# Patient Record
Sex: Female | Born: 1975 | Hispanic: Yes | Marital: Single | Smoking: Never smoker
Health system: Southern US, Community
[De-identification: ages and names within clinical notes are randomized; demographics above are authoritative.]

## PROBLEM LIST (undated history)

## (undated) DIAGNOSIS — R87629 Unspecified abnormal cytological findings in specimens from vagina: Secondary | ICD-10-CM

## (undated) HISTORY — DX: Unspecified abnormal cytological findings in specimens from vagina: R87.629

---

## 2014-03-21 ENCOUNTER — Encounter: Payer: Self-pay | Admitting: Physician Assistant

## 2014-03-21 ENCOUNTER — Ambulatory Visit (INDEPENDENT_AMBULATORY_CARE_PROVIDER_SITE_OTHER): Payer: BC Managed Care – PPO | Admitting: Physician Assistant

## 2014-03-21 ENCOUNTER — Other Ambulatory Visit (HOSPITAL_COMMUNITY)
Admission: RE | Admit: 2014-03-21 | Discharge: 2014-03-21 | Disposition: A | Discharge status: Home / Self Care | Payer: BC Managed Care – PPO | Source: Ambulatory Visit | Attending: Physician Assistant | Admitting: Physician Assistant

## 2014-03-21 VITALS — BP 112/85 | HR 83 | Ht 67.0 in | Wt 196.0 lb

## 2014-03-21 DIAGNOSIS — J309 Allergic rhinitis, unspecified: Secondary | ICD-10-CM

## 2014-03-21 DIAGNOSIS — Z01419 Encounter for gynecological examination (general) (routine) without abnormal findings: Secondary | ICD-10-CM | POA: Diagnosis not present

## 2014-03-21 DIAGNOSIS — Z124 Encounter for screening for malignant neoplasm of cervix: Secondary | ICD-10-CM | POA: Diagnosis not present

## 2014-03-21 DIAGNOSIS — Z113 Encounter for screening for infections with a predominantly sexual mode of transmission: Secondary | ICD-10-CM | POA: Insufficient documentation

## 2014-03-21 DIAGNOSIS — Z23 Encounter for immunization: Secondary | ICD-10-CM | POA: Diagnosis not present

## 2014-03-21 DIAGNOSIS — Z0000 Encounter for general adult medical examination without abnormal findings: Secondary | ICD-10-CM | POA: Diagnosis not present

## 2014-03-21 DIAGNOSIS — L819 Disorder of pigmentation, unspecified: Secondary | ICD-10-CM

## 2014-03-21 DIAGNOSIS — J302 Other seasonal allergic rhinitis: Secondary | ICD-10-CM | POA: Insufficient documentation

## 2014-03-21 DIAGNOSIS — H02719 Chloasma of unspecified eye, unspecified eyelid and periocular area: Secondary | ICD-10-CM

## 2014-03-21 DIAGNOSIS — N760 Acute vaginitis: Secondary | ICD-10-CM | POA: Insufficient documentation

## 2014-03-21 DIAGNOSIS — R87810 Cervical high risk human papillomavirus (HPV) DNA test positive: Secondary | ICD-10-CM | POA: Diagnosis not present

## 2014-03-21 DIAGNOSIS — Z1151 Encounter for screening for human papillomavirus (HPV): Secondary | ICD-10-CM | POA: Insufficient documentation

## 2014-03-21 NOTE — Patient Instructions (Signed)

## 2014-03-21 NOTE — Progress Notes (Signed)
  Subjective:     Jenna Atkins is a 38 y.o. female and is here for a comprehensive physical exam. The patient reports problems - she does have some ongoing left upper quadrant sharp pain that has bee evaluted and found nothing. not constant just occasional. .  History   Social History  . Marital Status: Single    Spouse Name: N/A    Number of Children: N/A  . Years of Education: N/A   Occupational History  . Not on file.   Social History Main Topics  . Smoking status: Never Smoker   . Smokeless tobacco: Not on file  . Alcohol Use: Yes  . Drug Use: No  . Sexual Activity: Yes   Other Topics Concern  . Not on file   Social History Narrative  . No narrative on file   There are no preventive care reminders to display for this patient.  The following portions of the patient's history were reviewed and updated as appropriate: allergies, current medications, past family history, past medical history, past social history, past surgical history and problem list.  Review of Systems A comprehensive review of systems was negative.   Objective:    BP 112/85  Pulse 83  Ht 5\' 7"  (1.702 m)  Wt 196 lb (88.905 kg)  BMI 30.69 kg/m2  LMP 03/14/2014 General appearance: alert, cooperative and appears stated age Head: Normocephalic, without obvious abnormality, atraumatic Eyes: conjunctivae/corneas clear. PERRL, EOM's intact. Fundi benign., dark circles under bilateral eyes.  Ears: normal TM's and external ear canals both ears Nose: Nares normal. Septum midline. Mucosa normal. No drainage or sinus tenderness. Throat: lips, mucosa, and tongue normal; teeth and gums normal Neck: no adenopathy, no carotid bruit, no JVD, supple, symmetrical, trachea midline and thyroid not enlarged, symmetric, no tenderness/mass/nodules Back: symmetric, no curvature. ROM normal. No CVA tenderness. Lungs: clear to auscultation bilaterally Breasts: normal appearance, no masses or tenderness Heart: regular  rate and rhythm, S1, S2 normal, no murmur, click, rub or gallop Abdomen: soft, non-tender; bowel sounds normal; no masses,  no organomegaly Pelvic: cervix normal in appearance, external genitalia normal, no adnexal masses or tenderness, no cervical motion tenderness, rectovaginal septum normal, uterus normal size, shape, and consistency and vagina normal without discharge Extremities: extremities normal, atraumatic, no cyanosis or edema Pulses: 2+ and symmetric Skin: Skin color, texture, turgor normal. No rashes or lesions Lymph nodes: Cervical, supraclavicular, and axillary nodes normal. Neurologic: Grossly normal    Assessment:    Healthy female exam.      Plan:    CPE- pap done, Tdap given today. Labs done per pt in last 6 months. Will wait for records. Calcium 1200mg  and vitamin D 800 units daily. encourgaed regular exercise at least 150minutes a week.   Black circles/seasonal allergies- suggested zyrtec 10mg  daily.  Left upper quadrant pain- has been evaluated. Not constant. Likely spasm or due to bad posture. Follow up with any changes or worsening symptoms.   See After Visit Summary for Counseling Recommendations

## 2014-03-24 LAB — CYTOLOGY - PAP

## 2014-03-25 ENCOUNTER — Encounter: Payer: Self-pay | Admitting: Physician Assistant

## 2014-03-25 ENCOUNTER — Other Ambulatory Visit: Payer: Self-pay | Admitting: Physician Assistant

## 2014-03-25 DIAGNOSIS — B977 Papillomavirus as the cause of diseases classified elsewhere: Secondary | ICD-10-CM | POA: Insufficient documentation

## 2014-03-25 MED ORDER — METRONIDAZOLE 500 MG PO TABS: 500.0000 mg | ORAL_TABLET | Freq: Two times a day (BID) | ORAL | Status: AC

## 2017-01-01 ENCOUNTER — Encounter: Payer: Self-pay | Admitting: Physician Assistant

## 2017-01-07 ENCOUNTER — Ambulatory Visit (INDEPENDENT_AMBULATORY_CARE_PROVIDER_SITE_OTHER): Payer: BLUE CROSS/BLUE SHIELD | Admitting: Physician Assistant

## 2017-01-07 ENCOUNTER — Encounter: Payer: Self-pay | Admitting: Physician Assistant

## 2017-01-07 VITALS — BP 136/84 | HR 91 | Ht 67.0 in | Wt 217.0 lb

## 2017-01-07 DIAGNOSIS — Z13220 Encounter for screening for lipoid disorders: Secondary | ICD-10-CM

## 2017-01-07 DIAGNOSIS — E669 Obesity, unspecified: Secondary | ICD-10-CM | POA: Insufficient documentation

## 2017-01-07 DIAGNOSIS — B977 Papillomavirus as the cause of diseases classified elsewhere: Secondary | ICD-10-CM

## 2017-01-07 DIAGNOSIS — Z1231 Encounter for screening mammogram for malignant neoplasm of breast: Secondary | ICD-10-CM | POA: Diagnosis not present

## 2017-01-07 DIAGNOSIS — Z0000 Encounter for general adult medical examination without abnormal findings: Secondary | ICD-10-CM | POA: Diagnosis not present

## 2017-01-07 DIAGNOSIS — Z131 Encounter for screening for diabetes mellitus: Secondary | ICD-10-CM

## 2017-01-07 LAB — COMPLETE METABOLIC PANEL WITH GFR
ALT: 43 U/L — ABNORMAL HIGH (ref 6–29)
AST: 37 U/L — ABNORMAL HIGH (ref 10–30)
Albumin: 4.3 g/dL (ref 3.6–5.1)
Alkaline Phosphatase: 99 U/L (ref 33–115)
BUN: 10 mg/dL (ref 7–25)
CALCIUM: 9.4 mg/dL (ref 8.6–10.2)
CHLORIDE: 105 mmol/L (ref 98–110)
CO2: 25 mmol/L (ref 20–31)
CREATININE: 0.8 mg/dL (ref 0.50–1.10)
GFR, Est African American: >89 mL/min (ref >=60)
Glucose, Bld: 105 mg/dL — ABNORMAL HIGH (ref 65–99)
Potassium: 4.6 mmol/L (ref 3.5–5.3)
Sodium: 139 mmol/L (ref 135–146)
Total Bilirubin: 0.5 mg/dL (ref 0.2–1.2)
Total Protein: 7.1 g/dL (ref 6.1–8.1)

## 2017-01-07 LAB — LIPID PANEL
CHOLESTEROL: 245 mg/dL — AB (ref <200)
HDL: 40 mg/dL — AB (ref >50)
LDL CALC: 153 mg/dL — AB (ref <100)
TRIGLYCERIDES: 259 mg/dL — AB (ref <150)
Total CHOL/HDL Ratio: 6.1 Ratio — ABNORMAL HIGH (ref <5.0)
VLDL: 52 mg/dL — AB (ref <30)

## 2017-01-07 LAB — TSH: TSH: 3.45 m[IU]/L

## 2017-01-07 NOTE — Patient Instructions (Signed)

## 2017-01-07 NOTE — Progress Notes (Signed)
Subjective:     Jenna Atkins is a 41 y.o. female and is here for a comprehensive physical exam. The patient reports no problems.  Social History   Social History  . Marital status: Single    Spouse name: N/A  . Number of children: N/A  . Years of education: N/A   Occupational History  . Not on file.   Social History Main Topics  . Smoking status: Never Smoker  . Smokeless tobacco: Never Used  . Alcohol use Yes  . Drug use: No  . Sexual activity: Yes   Other Topics Concern  . Not on file   Social History Narrative  . No narrative on file   Health Maintenance  Topic Date Due  . HIV Screening  01/06/1991  . INFLUENZA VACCINE  03/12/2017  . PAP SMEAR  03/21/2017  . TETANUS/TDAP  03/21/2024    The following portions of the patient's history were reviewed and updated as appropriate: allergies, current medications, past family history, past medical history, past social history, past surgical history and problem list.  Review of Systems A comprehensive review of systems was negative.   Objective:    BP 136/84   Pulse 91   Ht 5\' 7"  (1.702 m)   Wt 217 lb (98.4 kg)   BMI 33.99 kg/m  General appearance: alert, cooperative and appears stated age Head: Normocephalic, without obvious abnormality, atraumatic Eyes: conjunctivae/corneas clear. PERRL, EOM's intact. Fundi benign. Ears: normal TM's and external ear canals both ears Nose: Nares normal. Septum midline. Mucosa normal. No drainage or sinus tenderness. Throat: lips, mucosa, and tongue normal; teeth and gums normal Neck: no adenopathy, no carotid bruit, no JVD, supple, symmetrical, trachea midline and thyroid not enlarged, symmetric, no tenderness/mass/nodules Back: symmetric, no curvature. ROM normal. No CVA tenderness. Lungs: clear to auscultation bilaterally Breasts: normal appearance, no masses or tenderness Heart: regular rate and rhythm, S1, S2 normal, no murmur, click, rub or gallop Abdomen: soft,  non-tender; bowel sounds normal; no masses,  no organomegaly Extremities: extremities normal, atraumatic, no cyanosis or edema Pulses: 2+ and symmetric Skin: Skin color, texture, turgor normal. No rashes or lesions Lymph nodes: Cervical, supraclavicular, and axillary nodes normal. Neurologic: Alert and oriented X 3, normal strength and tone. Normal symmetric reflexes. Normal coordination and gait    Assessment:    Healthy female exam.      Plan:    [name].[name].Jenna Atkins was seen today for annual exam.  Diagnoses and all orders for this visit:  Routine physical examination -     Lipid panel -     COMPLETE METABOLIC PANEL WITH GFR -     TSH -     MM SCREENING BREAST TOMO BILATERAL; Future  Screening for lipid disorders -     Lipid panel  Screening for diabetes mellitus -     COMPLETE METABOLIC PANEL WITH GFR  Visit for screening mammogram -     MM SCREENING BREAST TOMO BILATERAL; Future  Obesity (BMI 35.0-39.9 without comorbidity) -     TSH  HPV in female   Ordered mammogram.  Pt will return for pap due to being on her cycle today. Pt sexually active with one partner. She mentions wanting to try to get pregnant. She currently uses condoms.  Encouraged to start prenatal vitamins. Explained she would be high risk. If not pregnant in 3 months I would consider referral to ob/gyn.  She is obese. Consider changing up diet. 150 minutes of exercise a week.  Pt declines any  STD testing.  See After Visit Summary for Counseling Recommendations

## 2017-01-08 ENCOUNTER — Encounter: Payer: Self-pay | Admitting: Physician Assistant

## 2017-01-08 ENCOUNTER — Telehealth: Payer: Self-pay

## 2017-01-08 DIAGNOSIS — R748 Abnormal levels of other serum enzymes: Secondary | ICD-10-CM | POA: Insufficient documentation

## 2017-01-08 DIAGNOSIS — E785 Hyperlipidemia, unspecified: Secondary | ICD-10-CM | POA: Insufficient documentation

## 2017-01-08 DIAGNOSIS — R899 Unspecified abnormal finding in specimens from other organs, systems and tissues: Secondary | ICD-10-CM

## 2017-01-08 DIAGNOSIS — R7301 Impaired fasting glucose: Secondary | ICD-10-CM | POA: Insufficient documentation

## 2017-01-08 NOTE — Progress Notes (Signed)
Call pt: thyroid in normal range.  LDL elevated, TG elevated. Need to cut fats in diet. Start fish oil 4000mg  daily for TG.  Elevated glucose need to add a1c.  Elevated liver enzymes. Are you using a lot of tylenol or drinking alcohol? Stop if you are and repeat in 2 weeks.

## 2017-01-08 NOTE — Progress Notes (Signed)
She can wait until the 2 week recheck of liver enyzmes.

## 2017-01-15 ENCOUNTER — Other Ambulatory Visit (HOSPITAL_COMMUNITY)
Admission: RE | Admit: 2017-01-15 | Discharge: 2017-01-15 | Disposition: A | Discharge status: Home / Self Care | Payer: BLUE CROSS/BLUE SHIELD | Source: Ambulatory Visit | Attending: Physician Assistant | Admitting: Physician Assistant

## 2017-01-15 ENCOUNTER — Encounter: Payer: Self-pay | Admitting: Physician Assistant

## 2017-01-15 ENCOUNTER — Ambulatory Visit (INDEPENDENT_AMBULATORY_CARE_PROVIDER_SITE_OTHER): Payer: BLUE CROSS/BLUE SHIELD | Admitting: Physician Assistant

## 2017-01-15 ENCOUNTER — Ambulatory Visit: Payer: BLUE CROSS/BLUE SHIELD

## 2017-01-15 VITALS — BP 126/81 | HR 89 | Ht 67.0 in | Wt 215.0 lb

## 2017-01-15 DIAGNOSIS — B977 Papillomavirus as the cause of diseases classified elsewhere: Secondary | ICD-10-CM

## 2017-01-15 DIAGNOSIS — Z01419 Encounter for gynecological examination (general) (routine) without abnormal findings: Secondary | ICD-10-CM | POA: Insufficient documentation

## 2017-01-15 DIAGNOSIS — R87613 High grade squamous intraepithelial lesion on cytologic smear of cervix (HGSIL): Secondary | ICD-10-CM | POA: Diagnosis not present

## 2017-01-15 NOTE — Progress Notes (Signed)
Subjective:     Jenna Atkins is a 41 y.o. woman who comes in today for a  pap smear only. Her most recent annual exam was on 01/07/17. Her most recent Pap smear was 2 years ago and showed HPV positive with no abnormal cells. Previous abnormal Pap smears: yes - 2 years ago. Contraception: condoms  The following portions of the patient's history were reviewed and updated as appropriate: allergies, current medications, past family history, past medical history, past social history, past surgical history and problem list.  Review of Systems A comprehensive review of systems was negative.   Objective:    BP 126/81   Pulse 89   Ht 5\' 7"  (1.702 m)   Wt 215 lb (97.5 kg)   BMI 33.67 kg/m  Pelvic Exam: external genitalia normal, no adnexal masses or tenderness, no cervical motion tenderness, vagina normal without discharge and area around cervical os was friable with some appearance of ulceration around the entire circumference.. Pap smear obtained.   Assessment:    Screening pap smear. Pt declined STD testing. She mentions wanting to get pregnant. She would be high risk. Start prenatal vitamins now. Follow up closely with GYN if having trouble getting pregnant. Wait until we get pap results to start trying.   Plan:    Follow up in 1 year, or as indicated by Pap results.

## 2017-01-19 ENCOUNTER — Encounter: Payer: Self-pay | Admitting: Physician Assistant

## 2017-01-19 DIAGNOSIS — R87613 High grade squamous intraepithelial lesion on cytologic smear of cervix (HGSIL): Secondary | ICD-10-CM | POA: Insufficient documentation

## 2017-01-19 LAB — CYTOLOGY - PAP
Diagnosis: HIGH — AB
HPV: DETECTED — AB

## 2017-01-19 NOTE — Addendum Note (Signed)
Addended byTandy Gaw: Riyan Haile on: 01/19/2017 08:46 PM   Modules accepted: Orders

## 2017-01-21 ENCOUNTER — Ambulatory Visit (INDEPENDENT_AMBULATORY_CARE_PROVIDER_SITE_OTHER): Payer: BLUE CROSS/BLUE SHIELD

## 2017-01-21 DIAGNOSIS — Z1231 Encounter for screening mammogram for malignant neoplasm of breast: Secondary | ICD-10-CM

## 2017-01-21 DIAGNOSIS — Z0000 Encounter for general adult medical examination without abnormal findings: Secondary | ICD-10-CM

## 2017-01-21 DIAGNOSIS — R899 Unspecified abnormal finding in specimens from other organs, systems and tissues: Secondary | ICD-10-CM | POA: Diagnosis not present

## 2017-01-22 LAB — HEPATIC FUNCTION PANEL
ALT: 40 U/L — ABNORMAL HIGH (ref 6–29)
AST: 27 U/L (ref 10–30)
Albumin: 4.3 g/dL (ref 3.6–5.1)
Alkaline Phosphatase: 89 U/L (ref 33–115)
Bilirubin, Direct: 0.1 mg/dL (ref <=0.2)
Indirect Bilirubin: 0.4 mg/dL (ref 0.2–1.2)
Total Bilirubin: 0.5 mg/dL (ref 0.2–1.2)
Total Protein: 7.2 g/dL (ref 6.1–8.1)

## 2017-01-22 LAB — HEMOGLOBIN A1C
Hgb A1c MFr Bld: 5.5 % (ref <5.7)
Mean Plasma Glucose: 111 mg/dL

## 2017-01-22 NOTE — Telephone Encounter (Signed)
Call pt: A1C normal range but climbing towards pre-diabetes. Start now watching carbs and sugars in diet.   Liver enzymes decrease from last recheck. Avoid any excessive/daily tylenol and alcohol. I think if we targeted weight loss likely would completely improve. Recheck in 3 months.

## 2017-01-22 NOTE — Progress Notes (Signed)
Call pt: normal mammogram. Follow up in 1 year.

## 2017-12-17 ENCOUNTER — Other Ambulatory Visit: Payer: Self-pay | Admitting: Physician Assistant

## 2017-12-17 DIAGNOSIS — Z1231 Encounter for screening mammogram for malignant neoplasm of breast: Secondary | ICD-10-CM

## 2018-01-22 ENCOUNTER — Ambulatory Visit: Payer: BLUE CROSS/BLUE SHIELD

## 2018-03-13 ENCOUNTER — Ambulatory Visit (INDEPENDENT_AMBULATORY_CARE_PROVIDER_SITE_OTHER): Payer: BLUE CROSS/BLUE SHIELD

## 2018-03-13 DIAGNOSIS — Z1231 Encounter for screening mammogram for malignant neoplasm of breast: Secondary | ICD-10-CM

## 2018-03-13 NOTE — Progress Notes (Signed)
Call pt: normal mammogram follow up in 1 year.

## 2018-03-24 ENCOUNTER — Ambulatory Visit (INDEPENDENT_AMBULATORY_CARE_PROVIDER_SITE_OTHER): Payer: BLUE CROSS/BLUE SHIELD | Admitting: Physician Assistant

## 2018-03-24 ENCOUNTER — Other Ambulatory Visit (HOSPITAL_COMMUNITY)
Admission: RE | Admit: 2018-03-24 | Discharge: 2018-03-24 | Disposition: A | Discharge status: Home / Self Care | Payer: BLUE CROSS/BLUE SHIELD | Source: Ambulatory Visit | Attending: Physician Assistant | Admitting: Physician Assistant

## 2018-03-24 ENCOUNTER — Encounter: Payer: Self-pay | Admitting: Physician Assistant

## 2018-03-24 VITALS — BP 141/91 | HR 104 | Ht 67.0 in | Wt 218.0 lb

## 2018-03-24 DIAGNOSIS — B977 Papillomavirus as the cause of diseases classified elsewhere: Secondary | ICD-10-CM

## 2018-03-24 DIAGNOSIS — Z0000 Encounter for general adult medical examination without abnormal findings: Secondary | ICD-10-CM | POA: Diagnosis not present

## 2018-03-24 DIAGNOSIS — R87810 Cervical high risk human papillomavirus (HPV) DNA test positive: Secondary | ICD-10-CM | POA: Diagnosis not present

## 2018-03-24 DIAGNOSIS — Z01419 Encounter for gynecological examination (general) (routine) without abnormal findings: Secondary | ICD-10-CM | POA: Diagnosis not present

## 2018-03-24 DIAGNOSIS — E785 Hyperlipidemia, unspecified: Secondary | ICD-10-CM

## 2018-03-24 DIAGNOSIS — Z13220 Encounter for screening for lipoid disorders: Secondary | ICD-10-CM

## 2018-03-24 DIAGNOSIS — Z6834 Body mass index (BMI) 34.0-34.9, adult: Secondary | ICD-10-CM

## 2018-03-24 DIAGNOSIS — Z131 Encounter for screening for diabetes mellitus: Secondary | ICD-10-CM

## 2018-03-24 DIAGNOSIS — R87613 High grade squamous intraepithelial lesion on cytologic smear of cervix (HGSIL): Secondary | ICD-10-CM | POA: Diagnosis not present

## 2018-03-24 DIAGNOSIS — K649 Unspecified hemorrhoids: Secondary | ICD-10-CM | POA: Insufficient documentation

## 2018-03-24 DIAGNOSIS — R87610 Atypical squamous cells of undetermined significance on cytologic smear of cervix (ASC-US): Secondary | ICD-10-CM | POA: Insufficient documentation

## 2018-03-24 DIAGNOSIS — F329 Major depressive disorder, single episode, unspecified: Secondary | ICD-10-CM

## 2018-03-24 DIAGNOSIS — E6609 Other obesity due to excess calories: Secondary | ICD-10-CM | POA: Diagnosis not present

## 2018-03-24 DIAGNOSIS — R7301 Impaired fasting glucose: Secondary | ICD-10-CM

## 2018-03-24 DIAGNOSIS — R4589 Other symptoms and signs involving emotional state: Secondary | ICD-10-CM | POA: Insufficient documentation

## 2018-03-24 DIAGNOSIS — Z800 Family history of malignant neoplasm of digestive organs: Secondary | ICD-10-CM | POA: Insufficient documentation

## 2018-03-24 DIAGNOSIS — K5901 Slow transit constipation: Secondary | ICD-10-CM | POA: Insufficient documentation

## 2018-03-24 DIAGNOSIS — F430 Acute stress reaction: Secondary | ICD-10-CM | POA: Insufficient documentation

## 2018-03-24 MED ORDER — ATORVASTATIN CALCIUM 80 MG PO TABS: 80.0000 mg | ORAL_TABLET | Freq: Every day | ORAL | 1 refills | Status: AC

## 2018-03-24 NOTE — Progress Notes (Signed)
Subjective:     Jenna Atkins is a 42 y.o. female and is here for a comprehensive physical exam. The patient reports she is concerned about colon cancer. at 6462 her father was dx with colon cancer and very sick. she has always had hemorrhoids and battled constipation. denies any stool changes, melena, hematochezia or abdominal pain. [name].  HPV with high grade squamous cells detected on last pap smear. Patient was called by GYN but never followed up with GYN.  Social History   Socioeconomic History  . Marital status: Single    Spouse name: Not on file  . Number of children: Not on file  . Years of education: Not on file  . Highest education level: Not on file  Occupational History  . Not on file  Social Needs  . Financial resource strain: Not on file  . Food insecurity:    Worry: Not on file    Inability: Not on file  . Transportation needs:    Medical: Not on file    Non-medical: Not on file  Tobacco Use  . Smoking status: Never Smoker  . Smokeless tobacco: Never Used  Substance and Sexual Activity  . Alcohol use: Yes  . Drug use: No  . Sexual activity: Yes  Lifestyle  . Physical activity:    Days per week: Not on file    Minutes per session: Not on file  . Stress: Not on file  Relationships  . Social connections:    Talks on phone: Not on file    Gets together: Not on file    Attends religious service: Not on file    Active member of club or organization: Not on file    Attends meetings of clubs or organizations: Not on file    Relationship status: Not on file  . Intimate partner violence:    Fear of current or ex partner: Not on file    Emotionally abused: Not on file    Physically abused: Not on file    Forced sexual activity: Not on file  Other Topics Concern  . Not on file  Social History Narrative  . Not on file   Health Maintenance  Topic Date Due  . HIV Screening  01/06/1991  . INFLUENZA VACCINE  03/12/2018  . PAP SMEAR  01/16/2020  . TETANUS/TDAP   03/21/2024    The following portions of the patient's history were reviewed and updated as appropriate: allergies, current medications, past family history, past medical history, past social history, past surgical history and problem list.  Review of Systems Pertinent items noted in HPI and remainder of comprehensive ROS otherwise negative.   Objective:    BP (!) 141/91   Pulse (!) 104   Ht 5\' 7"  (1.702 m)   Wt 218 lb (98.9 kg)   LMP 03/13/2018   BMI 34.14 kg/m  General appearance: alert, cooperative, appears stated age and mildly obese Head: Normocephalic, without obvious abnormality, atraumatic Eyes: conjunctivae/corneas clear. PERRL, EOM's intact. Fundi benign. Ears: normal TM's and external ear canals both ears Nose: Nares normal. Septum midline. Mucosa normal. No drainage or sinus tenderness. Throat: lips, mucosa, and tongue normal; teeth and gums normal Neck: no adenopathy, no carotid bruit, no JVD, supple, symmetrical, trachea midline and thyroid not enlarged, symmetric, no tenderness/mass/nodules Back: symmetric, no curvature. ROM normal. No CVA tenderness. Lungs: clear to auscultation bilaterally Heart: regular rate and rhythm, S1, S2 normal, no murmur, click, rub or gallop Abdomen: soft, non-tender; bowel sounds normal; no masses,  no organomegaly Pelvic: external genitalia normal, no adnexal masses or tenderness, no cervical motion tenderness, uterus normal size, shape, and consistency, vagina normal without discharge and cervix is friable and bled easily and very erythematous. as well as greenish discharge. Extremities: extremities normal, atraumatic, no cyanosis or edema Pulses: 2+ and symmetric Skin: Skin color, texture, turgor normal. No rashes or lesions Lymph nodes: Cervical, supraclavicular, and axillary nodes normal. Neurologic: Alert and oriented X 3, normal strength and tone. Normal symmetric reflexes. Normal coordination and gait    Assessment:    Healthy  female exam.      Plan:     Marland KitchenMarland KitchenNevayah was seen today for annual exam.  Diagnoses and all orders for this visit:  Routine physical examination -     Lipid Panel w/reflex Direct LDL -     COMPLETE METABOLIC PANEL WITH GFR -     TSH -     CBC with Differential/Platelet -     Cytology - PAP  Screening for lipid disorders -     Lipid Panel w/reflex Direct LDL  Screening for diabetes mellitus -     COMPLETE METABOLIC PANEL WITH GFR  Class 1 obesity due to excess calories without serious comorbidity with body mass index (BMI) of 34.0 to 34.9 in adult -     TSH  Hemorrhoids, unspecified hemorrhoid type -     Ambulatory referral to Gastroenterology  Slow transit constipation -     Ambulatory referral to Gastroenterology  Family history of colon cancer -     Ambulatory referral to Gastroenterology  Dyslipidemia (high LDL; low HDL)  Elevated fasting glucose  High grade squamous intraepithelial lesion (HGSIL) on cytologic smear of cervix -     Ambulatory referral to Obstetrics / Gynecology -     Cytology - PAP  HPV in female -     Ambulatory referral to Obstetrics / Gynecology -     Cytology - PAP  Stress reaction  Depressed mood   .[name] Depression screen PHQ 2/9 03/24/2018  Decreased Interest 1  Down, Depressed, Hopeless 2  PHQ - 2 Score 3  Altered sleeping 1  Tired, decreased energy 2  Change in appetite 1  Feeling bad or failure about yourself  0  Trouble concentrating 1  Moving slowly or fidgety/restless 0  Suicidal thoughts 0  PHQ-9 Score 8  Difficult doing work/chores Somewhat difficult    .[name] GAD 7 : Generalized Anxiety Score 03/24/2018  Nervous, Anxious, on Edge 1  Control/stop worrying 2  Worry too much - different things 2  Trouble relaxing 2  Restless 2  Easily annoyed or irritable 2  Afraid - awful might happen 2  Total GAD 7 Score 13  Anxiety Difficulty Very difficult    .[name] Discussed 150 minutes of exercise a week.  Encouraged vitamin D 1000  units and Calcium 1300mg  or 4 servings of dairy a day.  Mammogram up to date.  Fasting labs ordered today.  PaP repeated today. Pt never followed up with GYN. Last year she did have HPV and high grade squamous cell changes. Repeated pap today and made referral to GYN next door.   Pt is concerned about colon cancer. She is young to screen. She does have hemorrhoids, constipation, and family hx of colon cancer. I will refer to GI and let them decide on early screening or not. No worrisome signs today. Discussed treatment of constipation/hemorrhoids.   Discussed fathers dx and stress in her life. Consider counseling. Follow up if  would like to consider medication. Start walking regularly for stress relief.   See After Visit Summary for Counseling Recommendations

## 2018-03-24 NOTE — Addendum Note (Signed)
Addended by: Jomarie LongsBREEBACK, JADE L on: 03/24/2018 09:59 PM   Modules accepted: Orders

## 2018-03-24 NOTE — Patient Instructions (Addendum)

## 2018-03-24 NOTE — Progress Notes (Signed)
Call pt: your cholesterol is VERY elevated and you are at a HIGH risk of cardiovascular event with these numbers. I am sending a medication to help lower this. Recheck in 4 months.  Liver, kidney look good.

## 2018-03-25 LAB — CBC WITH DIFFERENTIAL/PLATELET
BASOS PCT: 1 %
Basophils Absolute: 61 cells/uL (ref 0–200)
Eosinophils Absolute: 140 cells/uL (ref 15–500)
Eosinophils Relative: 2.3 %
HCT: 40.1 % (ref 35.0–45.0)
Hemoglobin: 13.3 g/dL (ref 11.7–15.5)
Lymphs Abs: 1983 cells/uL (ref 850–3900)
MCH: 28.3 pg (ref 27.0–33.0)
MCHC: 33.2 g/dL (ref 32.0–36.0)
MCV: 85.3 fL (ref 80.0–100.0)
MONOS PCT: 4.8 %
MPV: 9.4 fL (ref 7.5–12.5)
Neutro Abs: 3623 cells/uL (ref 1500–7800)
Neutrophils Relative %: 59.4 %
PLATELETS: 398 10*3/uL (ref 140–400)
RBC: 4.7 10*6/uL (ref 3.80–5.10)
RDW: 13.2 % (ref 11.0–15.0)
TOTAL LYMPHOCYTE: 32.5 %
WBC: 6.1 10*3/uL (ref 3.8–10.8)
WBCMIX: 293 {cells}/uL (ref 200–950)

## 2018-03-25 LAB — LIPID PANEL W/REFLEX DIRECT LDL
CHOL/HDL RATIO: 6.4 (calc) — AB (ref <5.0)
Cholesterol: 293 mg/dL — ABNORMAL HIGH (ref <200)
HDL: 46 mg/dL — AB (ref >50)
LDL Cholesterol (Calc): 191 mg/dL (calc) — ABNORMAL HIGH
NON-HDL CHOLESTEROL (CALC): 247 mg/dL — AB (ref <130)
Triglycerides: 331 mg/dL — ABNORMAL HIGH (ref <150)

## 2018-03-25 LAB — COMPLETE METABOLIC PANEL WITH GFR
AG RATIO: 1.6 (calc) (ref 1.0–2.5)
ALT: 19 U/L (ref 6–29)
AST: 21 U/L (ref 10–30)
Albumin: 4.4 g/dL (ref 3.6–5.1)
Alkaline phosphatase (APISO): 92 U/L (ref 33–115)
BUN: 10 mg/dL (ref 7–25)
CHLORIDE: 104 mmol/L (ref 98–110)
CO2: 25 mmol/L (ref 20–32)
Calcium: 9.6 mg/dL (ref 8.6–10.2)
Creat: 0.83 mg/dL (ref 0.50–1.10)
GFR, EST NON AFRICAN AMERICAN: 87 mL/min/{1.73_m2} (ref >=60)
GFR, Est African American: 101 mL/min/{1.73_m2} (ref >=60)
GLUCOSE: 102 mg/dL — AB (ref 65–99)
Globulin: 2.8 g/dL (calc) (ref 1.9–3.7)
POTASSIUM: 4.5 mmol/L (ref 3.5–5.3)
Sodium: 138 mmol/L (ref 135–146)
Total Bilirubin: 0.5 mg/dL (ref 0.2–1.2)
Total Protein: 7.2 g/dL (ref 6.1–8.1)

## 2018-03-25 LAB — TSH: TSH: 3.51 mIU/L

## 2018-03-27 LAB — CYTOLOGY - PAP
DIAGNOSIS: UNDETERMINED — AB
HPV (WINDOPATH): DETECTED — AB
HPV 16/18/45 GENOTYPING: NEGATIVE

## 2018-03-28 NOTE — Progress Notes (Signed)
Call pt: last year cells looked a little more concerning. This year atypical. Will forward results to GYN since you have a follow up with them.  FYI on patient you will see on 8/21. See last years pap results as well.

## 2018-04-01 ENCOUNTER — Ambulatory Visit: Payer: BLUE CROSS/BLUE SHIELD | Admitting: Obstetrics & Gynecology

## 2018-04-01 ENCOUNTER — Encounter: Payer: Self-pay | Admitting: Obstetrics & Gynecology

## 2018-04-01 VITALS — BP 124/86 | HR 112 | Resp 16 | Ht 67.0 in | Wt 218.0 lb

## 2018-04-01 DIAGNOSIS — N871 Moderate cervical dysplasia: Secondary | ICD-10-CM | POA: Diagnosis not present

## 2018-04-01 DIAGNOSIS — R87810 Cervical high risk human papillomavirus (HPV) DNA test positive: Secondary | ICD-10-CM

## 2018-04-01 DIAGNOSIS — R87610 Atypical squamous cells of undetermined significance on cytologic smear of cervix (ASC-US): Secondary | ICD-10-CM | POA: Diagnosis not present

## 2018-04-01 DIAGNOSIS — Z23 Encounter for immunization: Secondary | ICD-10-CM | POA: Diagnosis not present

## 2018-04-01 NOTE — Progress Notes (Signed)
   Subjective:    Patient ID: Jenna PrudeKeren Atkins, female    DOB: [birth year], 42 y.o.   MRN: [id number]  HPI  She is here for a colpo due to a pap that showed ASCUS + HR HPV. She had a pap last year that showed HGSIL.   Review of Systems She uses condoms for contraception. [place] is [place]    Objective:   Physical Exam Breathing, conversing, and ambulating normally Well nourished, well hydrated Latina, no apparent distress  UPT negative, consent signed, time out done Cervix prepped with acetic acid. Transformation zone seen in its entirety. Colpo adequate. Acetowhite changes noted in a circumferential fashion around the os, extending about a cm in all directions. The cervix was friable, some mosaicism noted at the 1 and 5 o'clock positions where I biopsied.  Silver nitrate was used for hemostasis. ECC obtained. She tolerated the procedure well.     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

## 2018-04-01 NOTE — Addendum Note (Signed)
Addended by: Granville LewisLARK, Ieesha Abbasi L on: 04/01/2018 11:37 AM   Modules accepted: Orders

## 2018-04-02 DIAGNOSIS — K5900 Constipation, unspecified: Secondary | ICD-10-CM | POA: Diagnosis not present

## 2018-04-02 DIAGNOSIS — K649 Unspecified hemorrhoids: Secondary | ICD-10-CM | POA: Diagnosis not present

## 2018-04-02 DIAGNOSIS — K921 Melena: Secondary | ICD-10-CM | POA: Diagnosis not present

## 2018-04-02 DIAGNOSIS — Z800 Family history of malignant neoplasm of digestive organs: Secondary | ICD-10-CM | POA: Diagnosis not present

## 2018-04-05 NOTE — Progress Notes (Signed)
Yes please make appt to fully evaluate any head issues.

## 2018-04-07 ENCOUNTER — Ambulatory Visit: Payer: BLUE CROSS/BLUE SHIELD | Admitting: Obstetrics & Gynecology

## 2018-04-07 ENCOUNTER — Encounter: Payer: Self-pay | Admitting: Obstetrics & Gynecology

## 2018-04-07 VITALS — BP 124/78 | HR 82 | Resp 16 | Ht 67.0 in | Wt 218.0 lb

## 2018-04-07 DIAGNOSIS — D069 Carcinoma in situ of cervix, unspecified: Secondary | ICD-10-CM | POA: Diagnosis not present

## 2018-04-07 NOTE — Progress Notes (Signed)
   Subjective:    Patient ID: Jenna PrudeKeren Landau, female    DOB: [birth year], [age] y.o.   MRN: [id number]  HPI 42 yo P0 here to discuss treatment options. She had a ASCUS pap this year and a HGSIL pap last year. 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 The ECC was negative. She started the Gardasil series on the day of her colpo.    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

## 2018-04-10 ENCOUNTER — Ambulatory Visit (INDEPENDENT_AMBULATORY_CARE_PROVIDER_SITE_OTHER): Payer: BLUE CROSS/BLUE SHIELD | Admitting: Physician Assistant

## 2018-04-10 ENCOUNTER — Ambulatory Visit (INDEPENDENT_AMBULATORY_CARE_PROVIDER_SITE_OTHER): Payer: BLUE CROSS/BLUE SHIELD

## 2018-04-10 ENCOUNTER — Encounter: Payer: Self-pay | Admitting: Physician Assistant

## 2018-04-10 VITALS — BP 133/82 | HR 95 | Ht 67.0 in | Wt 218.0 lb

## 2018-04-10 DIAGNOSIS — M79631 Pain in right forearm: Secondary | ICD-10-CM

## 2018-04-10 DIAGNOSIS — M79644 Pain in right finger(s): Secondary | ICD-10-CM | POA: Diagnosis not present

## 2018-04-10 DIAGNOSIS — M25531 Pain in right wrist: Secondary | ICD-10-CM | POA: Diagnosis not present

## 2018-04-10 DIAGNOSIS — M654 Radial styloid tenosynovitis [de Quervain]: Secondary | ICD-10-CM | POA: Diagnosis not present

## 2018-04-10 DIAGNOSIS — Z23 Encounter for immunization: Secondary | ICD-10-CM

## 2018-04-10 MED ORDER — MELOXICAM 15 MG PO TABS: 15.0000 mg | ORAL_TABLET | Freq: Every day | ORAL | 0 refills | Status: DC

## 2018-04-10 NOTE — Progress Notes (Signed)
Call pt: normal xrays. Continue with treatment plan as discussed in office.

## 2018-04-10 NOTE — Patient Instructions (Signed)
De Quervain Tenosynovitis  Tendons attach muscles to bones. They also help with joint movements. When tendons become irritated or swollen, it is called tendinitis.  The extensor pollicis brevis (EPB) tendon connects the EPB muscle to a bone that is near the base of the thumb. The EPB muscle helps to straighten and extend the thumb. De Quervain tenosynovitis is a condition in which the EPB tendon lining (sheath) becomes irritated, thickened, and swollen. This condition is sometimes called stenosing tenosynovitis. This condition causes pain on the thumb side of the back of the wrist.  What are the causes?  Causes of this condition include:   Activities that repeatedly cause your thumb and wrist to extend.   A sudden increase in activity or change in activity that affects your wrist.    What increases the risk?  This condition is more likely to develop in:   Females.   People who have diabetes.   Women who have recently given birth.   People who are over 40 years of age.   People who do activities that involve repeated hand and wrist motions, such as tennis, racquetball, volleyball, gardening, and taking care of children.   People who do heavy labor.   People who have poor wrist strength and flexibility.   People who do not warm up properly before activities.    What are the signs or symptoms?  Symptoms of this condition include:   Pain or tenderness over the thumb side of the back of the wrist when your thumb and wrist are not moving.   Pain that gets worse when you straighten your thumb or extend your thumb or wrist.   Pain when the injured area is touched.   Locking or catching of the thumb joint while you bend and straighten your thumb.   Decreased thumb motion due to pain.   Swelling over the affected area.    How is this diagnosed?  This condition is diagnosed with a medical history and physical exam. Your health care provider will ask for details about your injury and ask about your  symptoms.  How is this treated?  Treatment may include the use of icing and medicines to reduce pain and swelling. You may also be advised to wear a splint or brace to limit your thumb and wrist motion. In less severe cases, treatment may also include working with a physical therapist to strengthen your wrist and calm the irritation around your EPB tendon sheath. In severe cases, surgery may be needed.  Follow these instructions at home:  If you have a splint or brace:   Wear it as told by your health care provider. Remove it only as told by your health care provider.   Loosen the splint or brace if your fingers become numb and tingle, or if they turn cold and blue.   Keep the splint or brace clean and dry.  Managing pain, stiffness, and swelling   If directed, apply ice to the injured area.  ? Put ice in a plastic bag.  ? Place a towel between your skin and the bag.  ? Leave the ice on for 20 minutes, 2-3 times per day.   Move your fingers often to avoid stiffness and to lessen swelling.   Raise (elevate) the injured area above the level of your heart while you are sitting or lying down.  General instructions   Return to your normal activities as told by your health care provider. Ask your health care provider   what activities are safe for you.   Take over-the-counter and prescription medicines only as told by your health care provider.   Keep all follow-up visits as told by your health care provider. This is important.   Do not drive or operate heavy machinery while taking prescription pain medicine.  Contact a health care provider if:   Your pain, tenderness, or swelling gets worse, even if you have had treatment.   You have numbness or tingling in your wrist, hand, or fingers on the injured side.  This information is not intended to replace advice given to you by your health care provider. Make sure you discuss any questions you have with your health care provider.  Document Released: 07/29/2005  Document Revised: 01/04/2016 Document Reviewed: 10/04/2014  [employer] Interactive Patient Education  2018 [employer]

## 2018-04-10 NOTE — Progress Notes (Signed)
Subjective:    Patient ID: Jenna PrudeKeren Wickersham, female    DOB: 04-15-1976, 42 y.o.   MRN: [id number]  HPI  Pt is a 42 yo female who presents to the clinic with right wrist and thumb pain on the radial side for 2 months. [place] when a [place]ball hit her right forearm. She started having pain but was bearable until was reinjuried a few weeks ago something else hitting the right forearm. She is right handed and continues to be very bothersome to get things done. She is slowly losing a lot of strength in her right hand. She is having a lot of pain moving her right thumb at all. She has tried some OTC creams with little benefit.   .. Active Ambulatory Problems    Diagnosis Date Noted  . Seasonal allergies 03/21/2014  . Dark circle under eye 03/21/2014  . 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 Obesity (BMI 35.0-39.9 without comorbidity) 01/07/2017  . Elevated fasting glucose 01/08/2017  . Elevated liver enzymes 01/08/2017  . Dyslipidemia (high LDL; low HDL) 01/08/2017  . High grade squamous intraepithelial lesion (HGSIL) on cytologic smear of cervix 01/19/2017  . Slow transit constipation 03/24/2018  . Hemorrhoids 03/24/2018  . Family history of colon cancer 03/24/2018  . Stress reaction 03/24/2018  . Class 1 obesity due to excess calories without serious comorbidity with body mass index (BMI) of 34.0 to 34.9 in adult 03/24/2018  . Depressed mood 03/24/2018  . 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 No Resolved Ambulatory Problems   Past Medical History:  Diagnosis Date  . Vaginal Pap smear, abnormal       Review of Systems See HPI>     Objective:   Physical Exam  Constitutional: She is oriented to person, place, and time. She appears well-developed and well-nourished.  HENT:  Head: Normocephalic and atraumatic.  Cardiovascular: Normal rate and regular rhythm.  Musculoskeletal:  Right thumb strength 0/5.  Positive  finkelstein, right side only.  [facility] up into the right forearm. No redness, swelling, warmth.  Neurological: She is alert and oriented to person, place, and time.          Assessment & Plan:  [name].[name].Diagnoses and all orders for this visit:  De Quervain's tenosynovitis, right -     DG Forearm Right -     DG Wrist Complete Right -     meloxicam (MOBIC) 15 MG tablet; Take 1 tablet (15 mg total) by mouth daily.  Right forearm pain -     DG Forearm Right -     DG Wrist Complete Right -     meloxicam (MOBIC) 15 MG tablet; Take 1 tablet (15 mg total) by mouth daily.  Thumb pain, right -     DG Forearm Right -     DG Wrist Complete Right -     meloxicam (MOBIC) 15 MG tablet; Take 1 tablet (15 mg total) by mouth daily.  Need for immunization against influenza -     Flu Vaccine QUAD 36+ mos IM   xrays confirm no evidence of fracture or worrisome lesions.  Symptoms and physical exam consistent with tenosynovitis.  Fitted for thumb spica wrist brace. Wear for 2 weeks. mobic given to use for next 2 weeks daily. Given 1 box sample of pennsaid to apply to wrist and thumb area.  Ice bid.  Home stretches given to start after  1 week.  Follow up with sports medicine here in clinic if not improving.

## 2018-04-12 ENCOUNTER — Encounter: Payer: Self-pay | Admitting: Physician Assistant

## 2018-04-12 DIAGNOSIS — M654 Radial styloid tenosynovitis [de Quervain]: Secondary | ICD-10-CM | POA: Insufficient documentation

## 2018-04-14 DIAGNOSIS — D123 Benign neoplasm of transverse colon: Secondary | ICD-10-CM | POA: Diagnosis not present

## 2018-04-14 DIAGNOSIS — Z1211 Encounter for screening for malignant neoplasm of colon: Secondary | ICD-10-CM | POA: Diagnosis not present

## 2018-04-14 DIAGNOSIS — D120 Benign neoplasm of cecum: Secondary | ICD-10-CM | POA: Diagnosis not present

## 2018-04-14 DIAGNOSIS — Z800 Family history of malignant neoplasm of digestive organs: Secondary | ICD-10-CM | POA: Diagnosis not present

## 2018-04-14 LAB — HM COLONOSCOPY

## 2018-04-27 ENCOUNTER — Ambulatory Visit (INDEPENDENT_AMBULATORY_CARE_PROVIDER_SITE_OTHER): Payer: BLUE CROSS/BLUE SHIELD | Admitting: Obstetrics & Gynecology

## 2018-04-27 ENCOUNTER — Other Ambulatory Visit (HOSPITAL_COMMUNITY)
Admission: RE | Admit: 2018-04-27 | Discharge: 2018-04-27 | Disposition: A | Discharge status: Home / Self Care | Payer: BLUE CROSS/BLUE SHIELD | Source: Ambulatory Visit | Attending: Obstetrics & Gynecology | Admitting: Obstetrics & Gynecology

## 2018-04-27 ENCOUNTER — Encounter: Payer: Self-pay | Admitting: Obstetrics & Gynecology

## 2018-04-27 VITALS — BP 135/94 | HR 111 | Resp 16 | Ht 67.0 in | Wt 220.0 lb

## 2018-04-27 DIAGNOSIS — Z01812 Encounter for preprocedural laboratory examination: Secondary | ICD-10-CM | POA: Diagnosis not present

## 2018-04-27 DIAGNOSIS — D069 Carcinoma in situ of cervix, unspecified: Secondary | ICD-10-CM | POA: Insufficient documentation

## 2018-04-27 DIAGNOSIS — Z3202 Encounter for pregnancy test, result negative: Secondary | ICD-10-CM

## 2018-04-27 DIAGNOSIS — D061 Carcinoma in situ of exocervix: Secondary | ICD-10-CM | POA: Diagnosis not present

## 2018-04-27 LAB — POCT URINE PREGNANCY: Preg Test, Ur: NEGATIVE

## 2018-04-27 NOTE — Progress Notes (Signed)
   Subjective:    Patient ID: Jenna Atkins, female    DOB: [birth year], 42 y.o.   MRN: [id number]  HPI 42 yo P0 here for LEEP due to CIN 2&3 on her cervical biopsies recently.   Review of Systems     Objective:   Physical Exam Breathing, conversing, and ambulating normally Well nourished, well hydrated White female, no apparent distress   Risks, benefits, alternatives, and limitations of procedure explained to patient, including pain, bleeding, infection, failure to remove abnormal tissue and failure to cure dysplasia, need for repeat procedures, damage to pelvic organs, cervical incompetence.  Role of HPV,cervical dysplasia and need for close followup was empasized. Informed written consent was obtained. All questions were answered. Time out performed. Urine pregnancy test was negative.  Procedure: The patient was placed in lithotomy position and the bivalved coated speculum was placed in the patient's vagina. A grounding pad placed on the patient. Acetic acid was applied to the cervix and areas of decreased uptake were noted around the transformation zone.   Local anesthesia was administered via an intracervical block using 15cc of 2% Lidocaine with epinephrine. The suction was turned on and the large LEEP loop  on 50 Watts of cutting current was used to excise the entire transformation zone and any areas of visible dysplasia. I then did a top hat.  I obtained an ECC.  Excellent hemostasis was achieved using roller ball coagulation set at 50 Watts coagulation current. At the end, I applied Monsel's solution. The speculum was removed from the vagina. Specimens were sent to pathology.  The patient tolerated the procedure well. Post-operative instructions given to patient, including instruction to seek medical attention for persistent bright red bleeding, fever, abdominal/pelvic pain, dysuria, nausea or vomiting. She was also told about the possibility of having copious yellow to black tinged  discharge for weeks. She was counseled to avoid anything in the vagina (sex/douching/tampons) for 3 weeks. She has a 4 week post-operative check to assess wound healing, review results and discuss further management.      Assessment & Plan:  CIN 2&3- await LEEP pathology Come back 3 weeks

## 2018-04-30 ENCOUNTER — Encounter: Payer: Self-pay | Admitting: Physician Assistant

## 2018-05-16 ENCOUNTER — Other Ambulatory Visit: Payer: Self-pay | Admitting: Physician Assistant

## 2018-05-16 DIAGNOSIS — M654 Radial styloid tenosynovitis [de Quervain]: Secondary | ICD-10-CM

## 2018-05-16 DIAGNOSIS — M79631 Pain in right forearm: Secondary | ICD-10-CM

## 2018-05-16 DIAGNOSIS — M79644 Pain in right finger(s): Secondary | ICD-10-CM

## 2018-05-18 ENCOUNTER — Ambulatory Visit (INDEPENDENT_AMBULATORY_CARE_PROVIDER_SITE_OTHER): Payer: BLUE CROSS/BLUE SHIELD | Admitting: Obstetrics & Gynecology

## 2018-05-18 ENCOUNTER — Encounter: Payer: Self-pay | Admitting: Obstetrics & Gynecology

## 2018-05-18 VITALS — BP 122/84 | HR 92 | Resp 16 | Ht 67.0 in | Wt 220.0 lb

## 2018-05-18 DIAGNOSIS — Z23 Encounter for immunization: Secondary | ICD-10-CM

## 2018-05-18 DIAGNOSIS — D069 Carcinoma in situ of cervix, unspecified: Secondary | ICD-10-CM

## 2018-05-18 DIAGNOSIS — Z9889 Other specified postprocedural states: Secondary | ICD-10-CM

## 2018-05-18 NOTE — Progress Notes (Signed)
   Subjective:    Patient ID: Jenna Atkins, female    DOB: 11-28-1975, 42 y.o.   MRN: [id number]  HPI  42 yo P0 here for follow up after LEEP on 04/27/18 for CIN 2&3. She had not had any post op issues/problems.  Review of Systems     Objective:   Physical Exam Breathing, conversing, and ambulating normally Well nourished, well hydrated White female, no apparent distress Cervix-healing well Pathology showed CIN3 with negative endocervical margins and positive ectocervical margins    Assessment & Plan:  Post op - stable + ectocervical margins after LEEP- repeat pap in 4 months. Repeat LEEP if pap not normal. Gardasil #2 today

## 2018-06-01 ENCOUNTER — Ambulatory Visit: Payer: BLUE CROSS/BLUE SHIELD

## 2018-09-28 DIAGNOSIS — J069 Acute upper respiratory infection, unspecified: Secondary | ICD-10-CM | POA: Diagnosis not present

## 2018-10-02 ENCOUNTER — Ambulatory Visit: Payer: BLUE CROSS/BLUE SHIELD

## 2019-02-16 ENCOUNTER — Other Ambulatory Visit: Payer: Self-pay | Admitting: Physician Assistant

## 2019-02-16 DIAGNOSIS — Z1231 Encounter for screening mammogram for malignant neoplasm of breast: Secondary | ICD-10-CM

## 2019-03-18 ENCOUNTER — Other Ambulatory Visit: Payer: Self-pay

## 2019-03-18 ENCOUNTER — Ambulatory Visit (INDEPENDENT_AMBULATORY_CARE_PROVIDER_SITE_OTHER): Payer: BLUE CROSS/BLUE SHIELD

## 2019-03-18 DIAGNOSIS — Z1231 Encounter for screening mammogram for malignant neoplasm of breast: Secondary | ICD-10-CM

## 2019-03-19 ENCOUNTER — Other Ambulatory Visit: Payer: Self-pay | Admitting: Physician Assistant

## 2019-03-19 DIAGNOSIS — R928 Other abnormal and inconclusive findings on diagnostic imaging of breast: Secondary | ICD-10-CM

## 2019-03-19 NOTE — Progress Notes (Signed)
Left breast asymmetry and need more imaging. Let us know if not contacted for more images.

## 2019-03-25 ENCOUNTER — Ambulatory Visit
Admission: RE | Admit: 2019-03-25 | Discharge: 2019-03-25 | Disposition: A | Discharge status: Home / Self Care | Payer: BLUE CROSS/BLUE SHIELD | Source: Ambulatory Visit | Attending: Physician Assistant | Admitting: Physician Assistant

## 2019-03-25 ENCOUNTER — Other Ambulatory Visit: Payer: Self-pay | Admitting: Physician Assistant

## 2019-03-25 ENCOUNTER — Other Ambulatory Visit: Payer: Self-pay

## 2019-03-25 DIAGNOSIS — R928 Other abnormal and inconclusive findings on diagnostic imaging of breast: Secondary | ICD-10-CM | POA: Diagnosis not present

## 2019-03-25 DIAGNOSIS — N6489 Other specified disorders of breast: Secondary | ICD-10-CM | POA: Diagnosis not present

## 2019-03-25 NOTE — Progress Notes (Signed)
Normal mammogram

## 2019-09-22 DIAGNOSIS — M9901 Segmental and somatic dysfunction of cervical region: Secondary | ICD-10-CM | POA: Diagnosis not present

## 2019-09-22 DIAGNOSIS — M5412 Radiculopathy, cervical region: Secondary | ICD-10-CM | POA: Diagnosis not present

## 2019-09-22 DIAGNOSIS — M9902 Segmental and somatic dysfunction of thoracic region: Secondary | ICD-10-CM | POA: Diagnosis not present

## 2019-09-22 DIAGNOSIS — M546 Pain in thoracic spine: Secondary | ICD-10-CM | POA: Diagnosis not present

## 2020-01-18 IMAGING — DX DG FOREARM 2V*R*
2 series · 2 of 2 positions shown · non-contrast
Comparison: None.

CLINICAL DATA: Right forearm pain for 2 months following blunt
trauma, initial encounter

EXAM:
RIGHT FOREARM - 2 VIEW

[forearm ap]
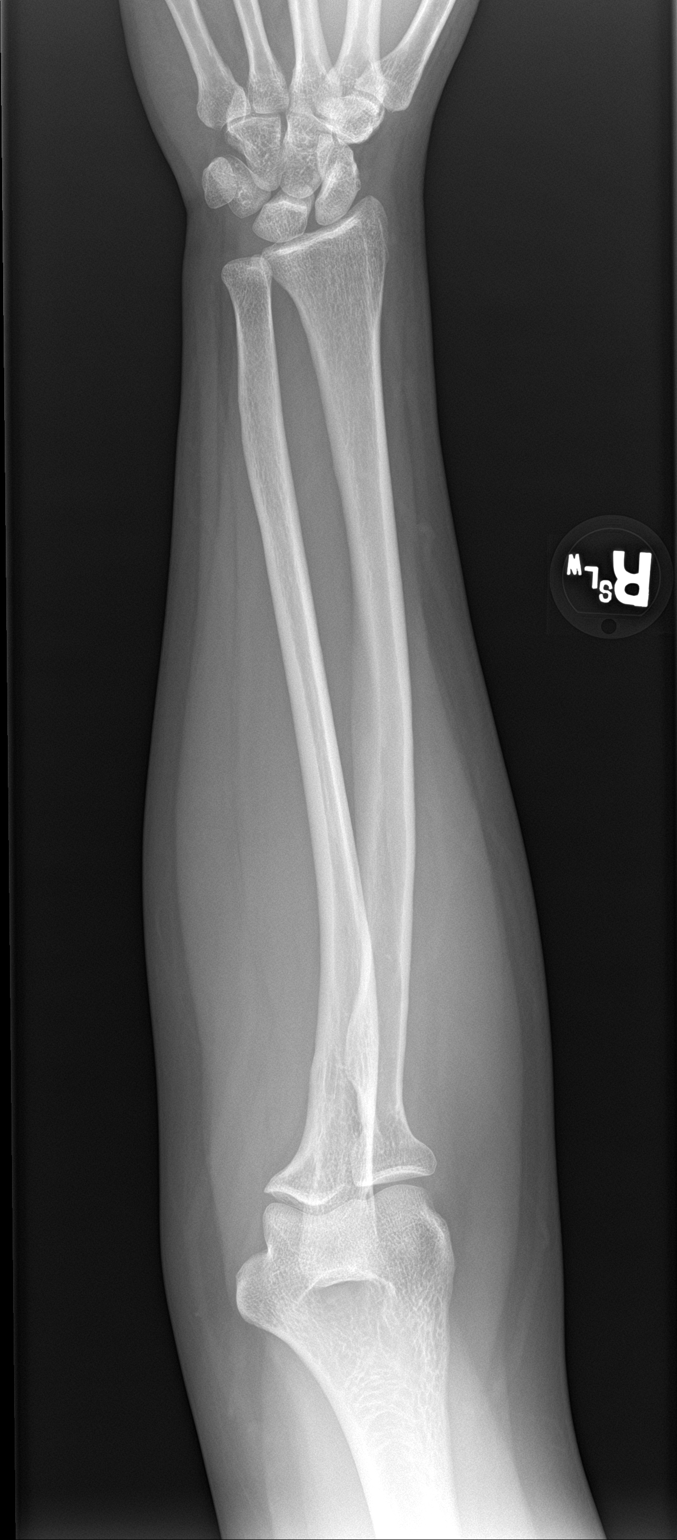

[forearm lat]
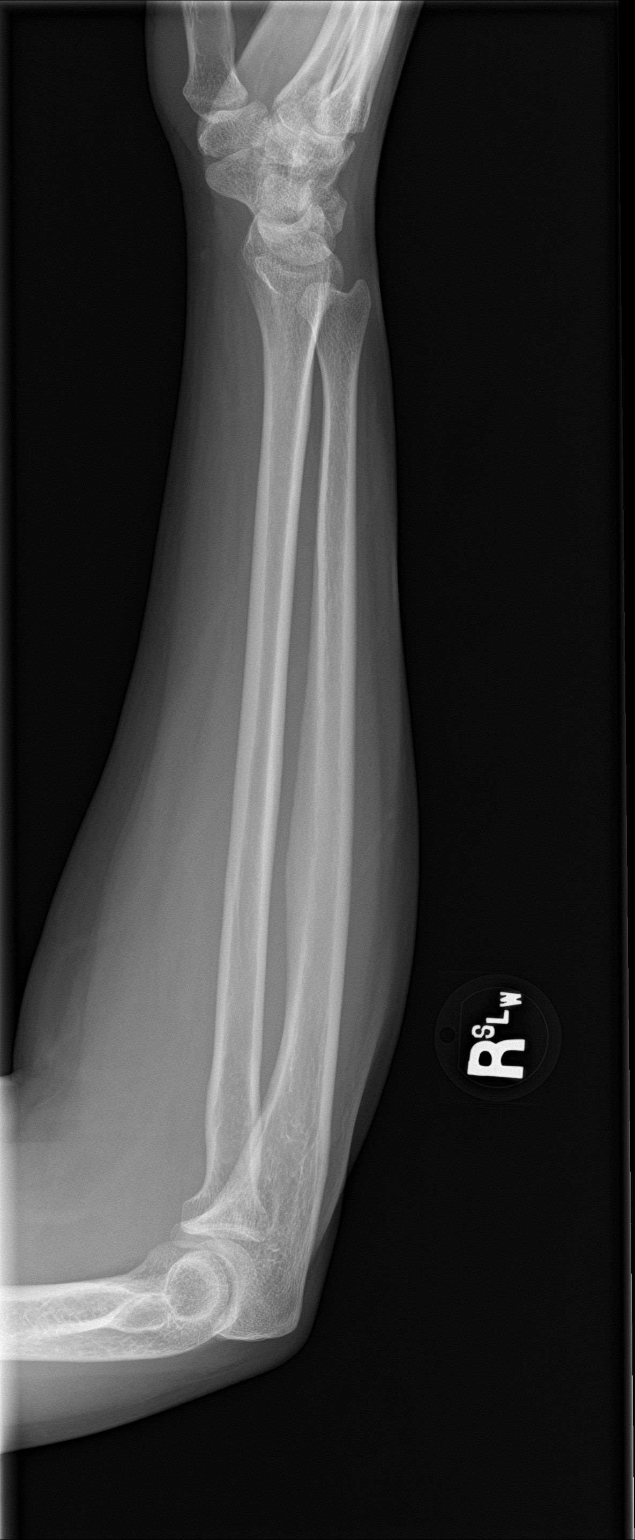

[2 of 2 positions shown; findings below may reference images not displayed]

FINDINGS: There is no evidence of fracture or other focal bone lesions. Soft
tissues are unremarkable.
IMPRESSION: No acute abnormality noted.

## 2020-01-18 IMAGING — DX DG WRIST COMPLETE 3+V*R*
4 series · 4 of 4 positions shown · non-contrast
Comparison: None.

CLINICAL DATA: Blunt trauma 2 months ago with persistent wrist and
thumb pain, initial encounter

EXAM:
RIGHT WRIST - COMPLETE 3+ VIEW

[wrist pa]
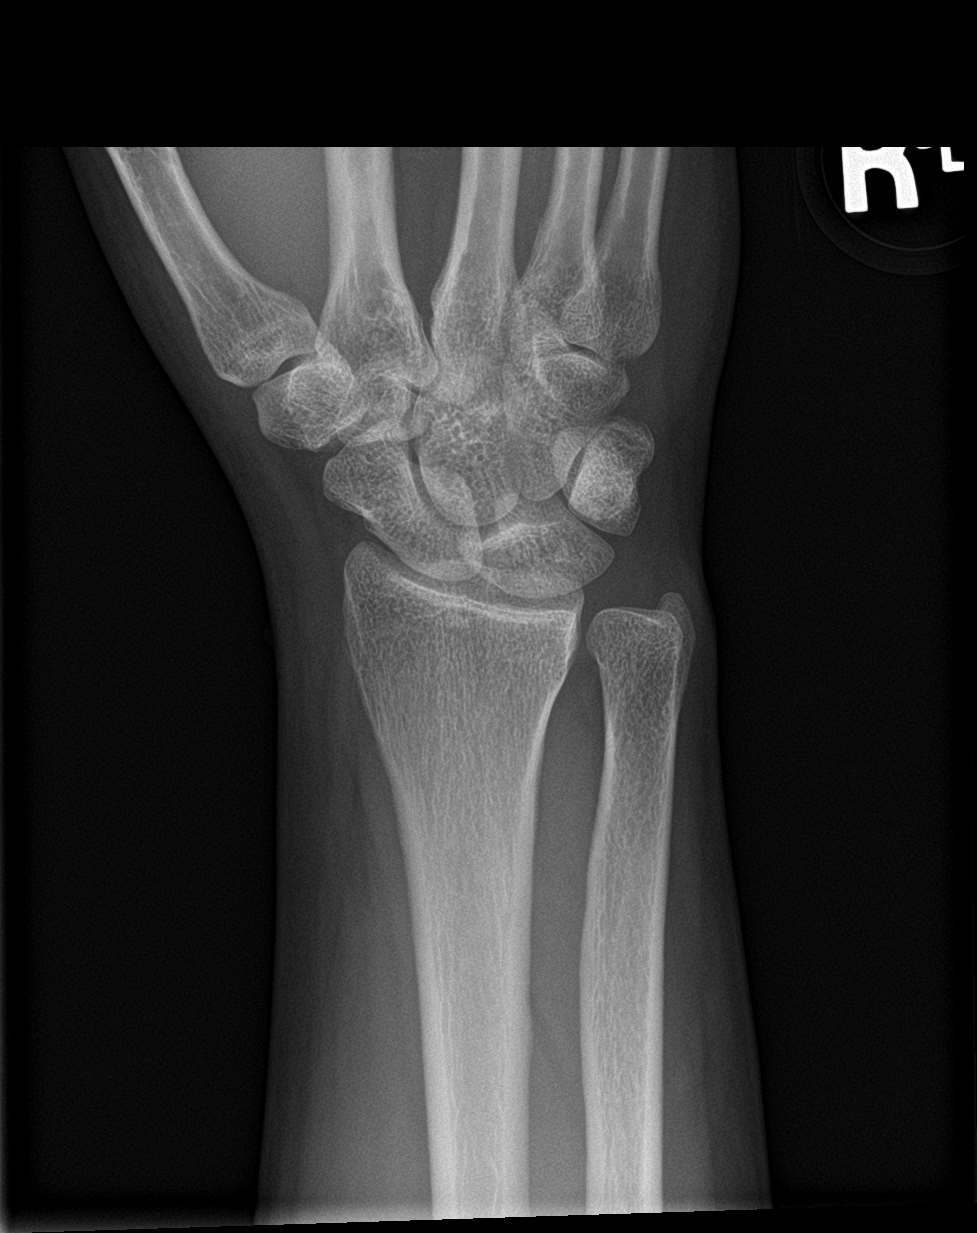

[wrist obl]
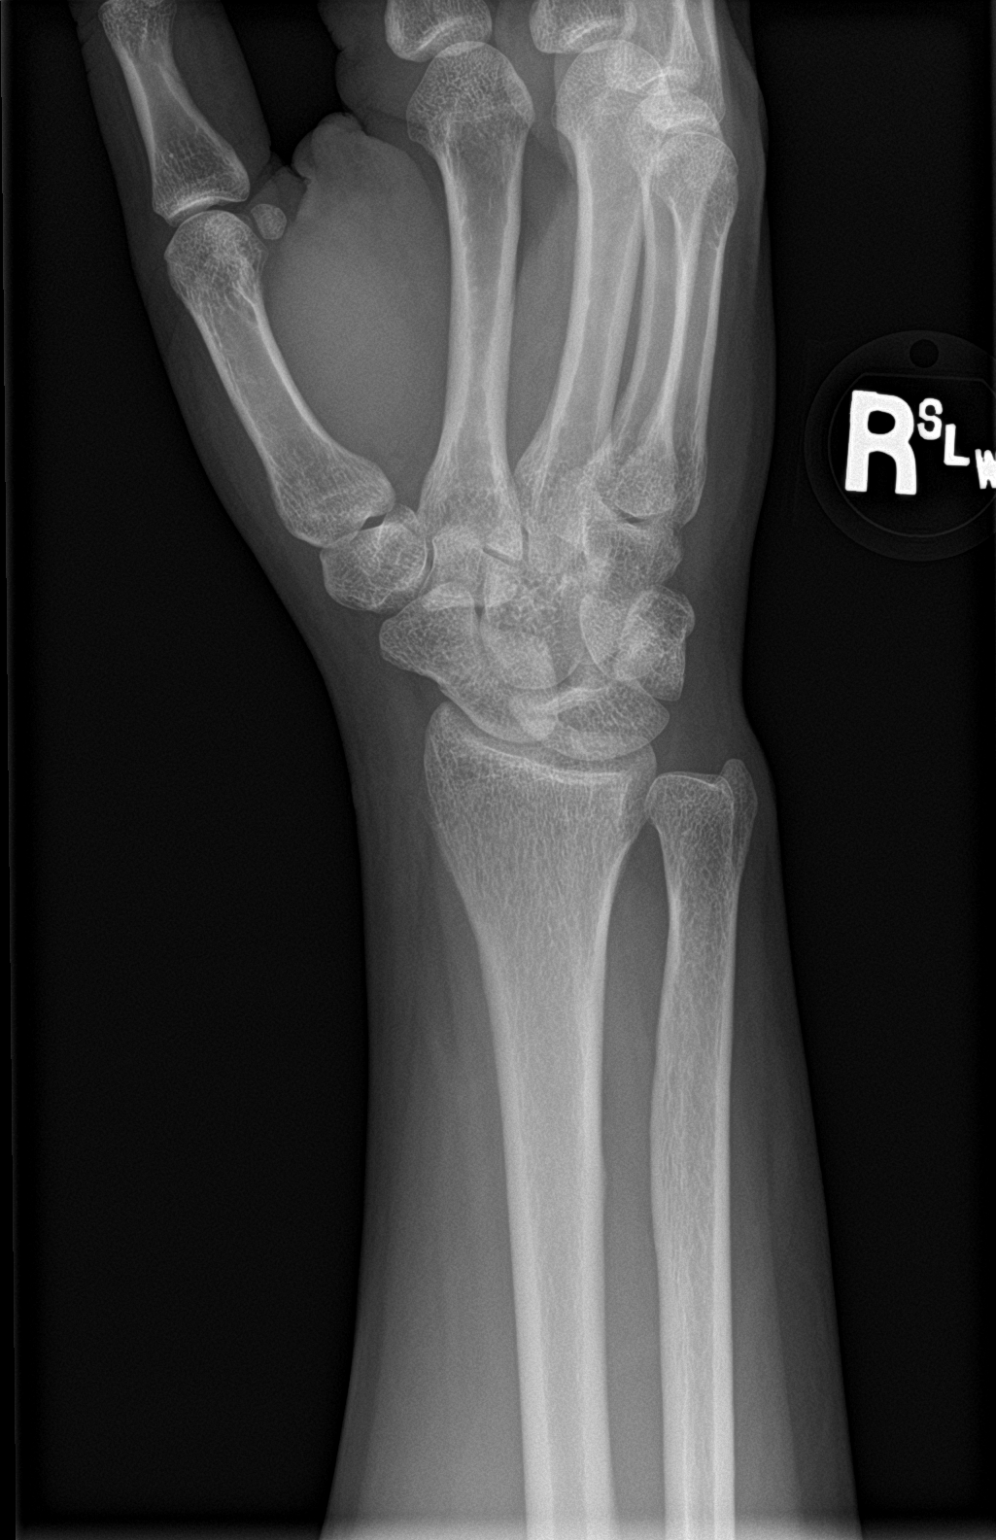

[wrist lat]
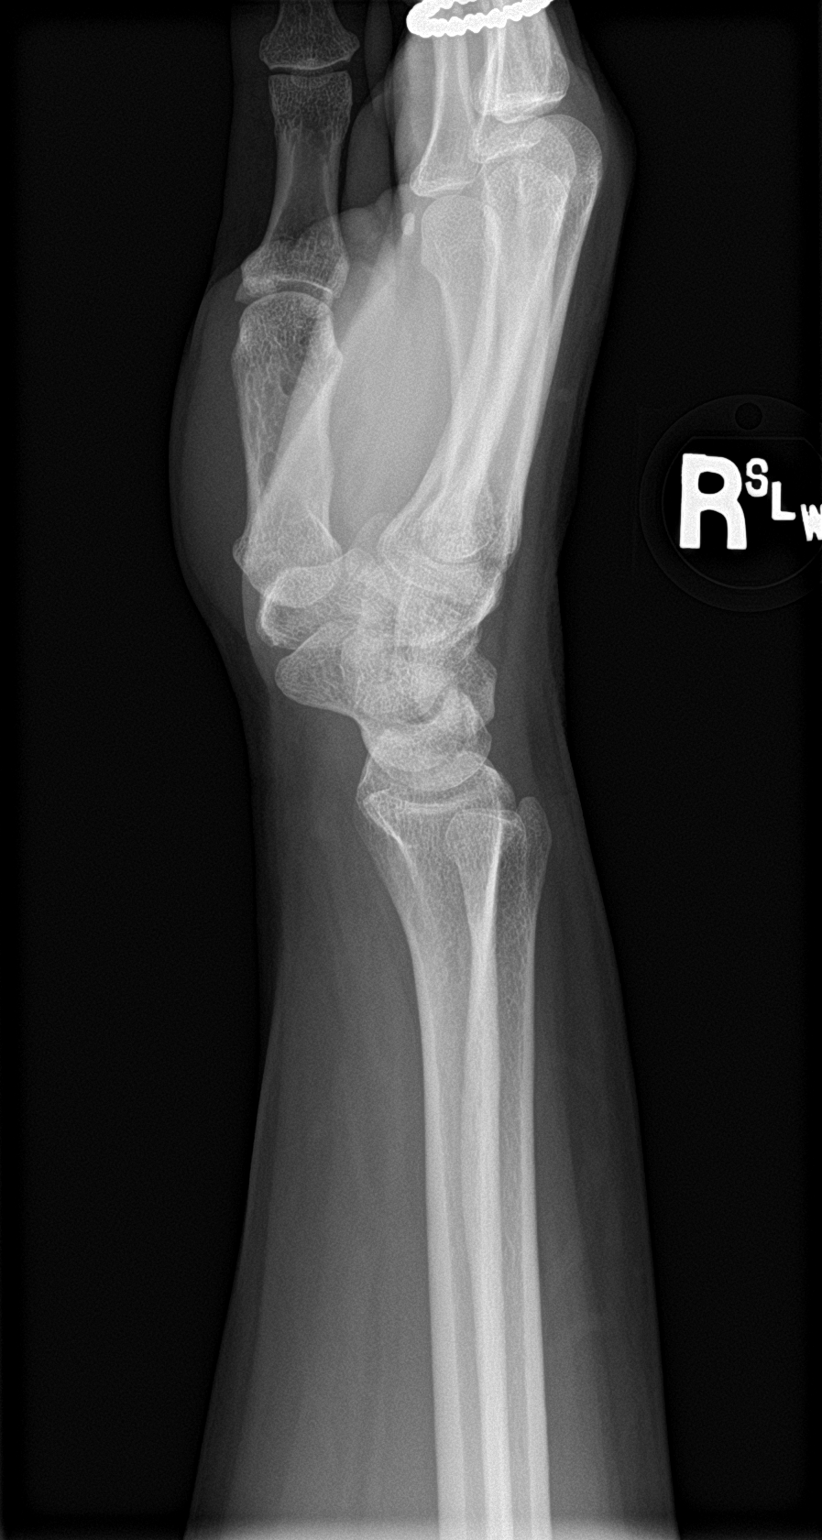

[wrist navicular]
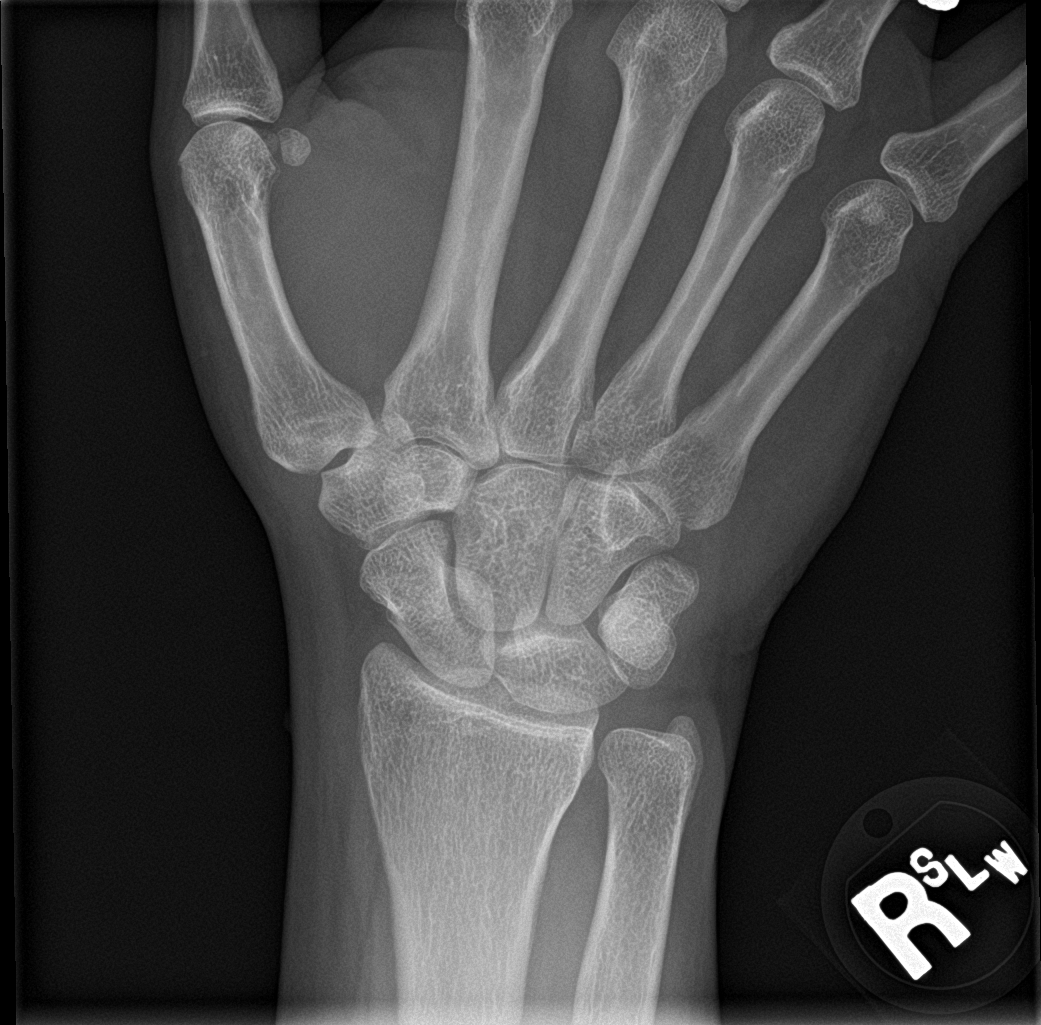

[4 of 4 positions shown; findings below may reference images not displayed]

FINDINGS: There is no evidence of fracture or dislocation. There is no
evidence of arthropathy or other focal bone abnormality. Soft
tissues are unremarkable.
IMPRESSION: No acute abnormality noted.

## 2020-07-22 DIAGNOSIS — L03011 Cellulitis of right finger: Secondary | ICD-10-CM | POA: Diagnosis not present
